# Patient Record
Sex: Female | Born: 1965 | Race: White | Hispanic: No | Marital: Married | Smoking: Never smoker
Health system: Southern US, Community
[De-identification: ages and names within clinical notes are randomized; demographics above are authoritative.]

## PROBLEM LIST (undated history)

## (undated) DIAGNOSIS — Z9889 Other specified postprocedural states: Secondary | ICD-10-CM

## (undated) DIAGNOSIS — R112 Nausea with vomiting, unspecified: Secondary | ICD-10-CM

## (undated) DIAGNOSIS — B999 Unspecified infectious disease: Secondary | ICD-10-CM

---

## 2009-03-30 ENCOUNTER — Encounter: Admission: RE | Admit: 2009-03-30 | Discharge: 2009-03-30 | Payer: Self-pay | Admitting: Specialist

## 2012-09-18 ENCOUNTER — Other Ambulatory Visit: Payer: Self-pay

## 2012-09-18 DIAGNOSIS — Z1231 Encounter for screening mammogram for malignant neoplasm of breast: Secondary | ICD-10-CM

## 2012-10-16 ENCOUNTER — Ambulatory Visit
Admission: RE | Admit: 2012-10-16 | Discharge: 2012-10-16 | Disposition: A | Discharge status: Home / Self Care | Payer: BC Managed Care – PPO | Source: Ambulatory Visit

## 2012-10-16 DIAGNOSIS — Z1231 Encounter for screening mammogram for malignant neoplasm of breast: Secondary | ICD-10-CM

## 2015-11-29 DIAGNOSIS — R109 Unspecified abdominal pain: Secondary | ICD-10-CM | POA: Diagnosis not present

## 2015-11-29 DIAGNOSIS — D649 Anemia, unspecified: Secondary | ICD-10-CM | POA: Diagnosis not present

## 2015-11-30 DIAGNOSIS — K828 Other specified diseases of gallbladder: Secondary | ICD-10-CM | POA: Diagnosis not present

## 2015-11-30 DIAGNOSIS — K760 Fatty (change of) liver, not elsewhere classified: Secondary | ICD-10-CM | POA: Diagnosis not present

## 2015-11-30 DIAGNOSIS — K8020 Calculus of gallbladder without cholecystitis without obstruction: Secondary | ICD-10-CM | POA: Diagnosis not present

## 2015-12-01 ENCOUNTER — Other Ambulatory Visit: Payer: Self-pay | Admitting: Internal Medicine

## 2015-12-01 DIAGNOSIS — R109 Unspecified abdominal pain: Secondary | ICD-10-CM

## 2015-12-07 DIAGNOSIS — K8010 Calculus of gallbladder with chronic cholecystitis without obstruction: Secondary | ICD-10-CM | POA: Diagnosis not present

## 2015-12-11 ENCOUNTER — Encounter (HOSPITAL_COMMUNITY): Payer: Self-pay | Admitting: *Deleted

## 2015-12-12 ENCOUNTER — Ambulatory Visit: Payer: Self-pay | Admitting: Surgery

## 2015-12-12 ENCOUNTER — Encounter (HOSPITAL_COMMUNITY): Payer: Self-pay | Admitting: Anesthesiology

## 2015-12-12 ENCOUNTER — Other Ambulatory Visit: Payer: Self-pay

## 2015-12-12 NOTE — H&P (Signed)
Chief Complaint:  Recurrent right upper quadrant pain  History of Present Illness:  Courtney Freeman is an 50 y.o. female who has had at least 2 bouts of 6 hour right upper quadrant pain with nausea and vomiting.  Ultrasound demonstrated gallstones.  Informed consent was obtained about laparoscopic and open cholecystectomy.    Past Medical History  Diagnosis Date  . PONV (postoperative nausea and vomiting)     after first C Section with receiving codeine  . Infection     pt states had infection in incision after first 3 C Sections; pt states received antibiotic prior to 4th C Section did not have any difficulty     Past Surgical History  Procedure Laterality Date  . Cesarean section      times 4    No current facility-administered medications for this encounter.   Current Outpatient Prescriptions  Medication Sig Dispense Refill  . cetirizine-pseudoephedrine (ZYRTEC-D) 5-120 MG tablet Take 1 tablet by mouth 2 (two) times daily.     Marland Kitchen. ibuprofen (ADVIL) 200 MG tablet Take 200 mg by mouth every 6 (six) hours as needed for moderate pain.     Marland Kitchen. oxyCODONE-acetaminophen (PERCOCET/ROXICET) 5-325 MG tablet Take 1 tablet by mouth every 4 (four) hours as needed for moderate pain.   0   Codeine History reviewed. No pertinent family history. Social History:   reports that she has never smoked. She has never used smokeless tobacco. She reports that she drinks alcohol. She reports that she does not use illicit drugs.   REVIEW OF SYSTEMS : Negative except for see problem list  Physical Exam:   Last menstrual period 11/25/2015. There is no height or weight on file to calculate BMI.  Gen:  WDWN WF NAD  Neurological: Alert and oriented to person, place, and time. Motor and sensory function is grossly intact  Head: Normocephalic and atraumatic.  Eyes: Conjunctivae are normal. Pupils are equal, round, and reactive to light. No scleral icterus.  Neck: Normal range of motion. Neck supple. No tracheal  deviation or thyromegaly present.  Cardiovascular:  SR without murmurs or gallops.  No carotid bruits Breast:  Not examined Respiratory: Effort normal.  No respiratory distress. No chest wall tenderness. Breath sounds normal.  No wheezes, rales or rhonchi.  Abdomen:  Some residual soreness in the right upper quadrant GU:  Not examined Musculoskeletal: Normal range of motion. Extremities are nontender. No cyanosis, edema or clubbing noted Lymphadenopathy: No cervical, preauricular, postauricular or axillary adenopathy is present Skin: Skin is warm and dry. No rash noted. No diaphoresis. No erythema. No pallor. Pscyh: Normal mood and affect. Behavior is normal. Judgment and thought content normal.   LABORATORY RESULTS: No results found for this or any previous visit (from the past 48 hour(s)).   RADIOLOGY RESULTS: No results found.  Problem List: There are no active problems to display for this patient.   Assessment & Plan: Symptomatic gallstones with cholecystitis    Matt B. Daphine DeutscherMartin, MD, [facility]  [facility] [phone] beeper [phone]  12/12/2015 5:51 AM

## 2015-12-12 NOTE — Anesthesia Preprocedure Evaluation (Addendum)
Anesthesia Evaluation  Patient identified by MRN, date of birth, ID band Patient awake    Reviewed: Allergy & Precautions, NPO status , Patient's Chart, lab work & pertinent test results  History of Anesthesia Complications (+) PONV and history of anesthetic complications  Airway Mallampati: II  TM Distance: >3 FB Neck ROM: Full    Dental no notable dental hx.    Pulmonary neg pulmonary ROS,    Pulmonary exam normal breath sounds clear to auscultation       Cardiovascular Exercise Tolerance: Good negative cardio ROS Normal cardiovascular exam Rhythm:Regular Rate:Normal     Neuro/Psych negative neurological ROS  negative psych ROS   GI/Hepatic negative GI ROS, Neg liver ROS,   Endo/Other  negative endocrine ROS  Renal/GU negative Renal ROS  negative genitourinary   Musculoskeletal negative musculoskeletal ROS (+)   Abdominal   Peds negative pediatric ROS (+)  Hematology negative hematology ROS (+)   Anesthesia Other Findings   Reproductive/Obstetrics negative OB ROS                             Anesthesia Physical Anesthesia Plan  ASA: I  Anesthesia Plan: General   Post-op Pain Management:    Induction: Intravenous  Airway Management Planned: Oral ETT  Additional Equipment:   Intra-op Plan:   Post-operative Plan: Extubation in OR  Informed Consent: I have reviewed the patients History and Physical, chart, labs and discussed the procedure including the risks, benefits and alternatives for the proposed anesthesia with the patient or authorized representative who has indicated his/her understanding and acceptance.   Dental advisory given  Plan Discussed with: CRNA  Anesthesia Plan Comments:         Anesthesia Quick Evaluation

## 2015-12-13 ENCOUNTER — Encounter (HOSPITAL_COMMUNITY)
Admission: RE | Disposition: A | Discharge status: Home / Self Care | Payer: Self-pay | Source: Ambulatory Visit | Attending: Surgery

## 2015-12-13 ENCOUNTER — Ambulatory Visit (HOSPITAL_COMMUNITY): Payer: BLUE CROSS/BLUE SHIELD | Admitting: Anesthesiology

## 2015-12-13 ENCOUNTER — Ambulatory Visit (HOSPITAL_COMMUNITY): Payer: BLUE CROSS/BLUE SHIELD

## 2015-12-13 ENCOUNTER — Encounter (HOSPITAL_COMMUNITY): Payer: Self-pay | Admitting: *Deleted

## 2015-12-13 ENCOUNTER — Observation Stay (HOSPITAL_COMMUNITY)
Admission: RE | Admit: 2015-12-13 | Discharge: 2015-12-14 | Disposition: A | Discharge status: Home / Self Care | Payer: BLUE CROSS/BLUE SHIELD | Source: Ambulatory Visit | Attending: Surgery | Admitting: Surgery

## 2015-12-13 DIAGNOSIS — Z419 Encounter for procedure for purposes other than remedying health state, unspecified: Secondary | ICD-10-CM

## 2015-12-13 DIAGNOSIS — K8010 Calculus of gallbladder with chronic cholecystitis without obstruction: Secondary | ICD-10-CM | POA: Diagnosis not present

## 2015-12-13 DIAGNOSIS — K8020 Calculus of gallbladder without cholecystitis without obstruction: Secondary | ICD-10-CM | POA: Diagnosis not present

## 2015-12-13 DIAGNOSIS — Z9049 Acquired absence of other specified parts of digestive tract: Secondary | ICD-10-CM

## 2015-12-13 DIAGNOSIS — Z79899 Other long term (current) drug therapy: Secondary | ICD-10-CM | POA: Diagnosis not present

## 2015-12-13 DIAGNOSIS — K439 Ventral hernia without obstruction or gangrene: Secondary | ICD-10-CM | POA: Diagnosis not present

## 2015-12-13 DIAGNOSIS — K811 Chronic cholecystitis: Secondary | ICD-10-CM | POA: Diagnosis present

## 2015-12-13 HISTORY — DX: Unspecified infectious disease: B99.9

## 2015-12-13 HISTORY — DX: Other specified postprocedural states: Z98.890

## 2015-12-13 HISTORY — DX: Nausea with vomiting, unspecified: R11.2

## 2015-12-13 HISTORY — PX: CHOLECYSTECTOMY: SHX55

## 2015-12-13 LAB — CBC
HCT: 34.2 % — ABNORMAL LOW (ref 36.0–46.0)
HCT: 33.3 % — ABNORMAL LOW (ref 36.0–46.0)
HEMOGLOBIN: 11 g/dL — AB (ref 12.0–15.0)
Hemoglobin: 10.6 g/dL — ABNORMAL LOW (ref 12.0–15.0)
MCH: 26.1 pg (ref 26.0–34.0)
MCH: 26.6 pg (ref 26.0–34.0)
MCHC: 32.2 g/dL (ref 30.0–36.0)
MCHC: 31.8 g/dL (ref 30.0–36.0)
MCV: 81 fL (ref 78.0–100.0)
MCV: 83.5 fL (ref 78.0–100.0)
PLATELETS: 301 10*3/uL (ref 150–400)
PLATELETS: 285 10*3/uL (ref 150–400)
RBC: 4.22 MIL/uL (ref 3.87–5.11)
RBC: 3.99 MIL/uL (ref 3.87–5.11)
RDW: 14.8 % (ref 11.5–15.5)
RDW: 14.9 % (ref 11.5–15.5)
WBC: 6.1 10*3/uL (ref 4.0–10.5)
WBC: 8.9 10*3/uL (ref 4.0–10.5)

## 2015-12-13 LAB — CREATININE, SERUM
Creatinine, Ser: 0.7 mg/dL (ref 0.44–1.00)
GFR calc Af Amer: >60 mL/min (ref >60)
GFR calc non Af Amer: >60 mL/min (ref >60)

## 2015-12-13 LAB — HCG, SERUM, QUALITATIVE: PREG SERUM: NEGATIVE

## 2015-12-13 SURGERY — LAPAROSCOPIC CHOLECYSTECTOMY WITH INTRAOPERATIVE CHOLANGIOGRAM
Anesthesia: General | Site: Abdomen

## 2015-12-13 MED ORDER — ACETAMINOPHEN 10 MG/ML IV SOLN
INTRAVENOUS | Status: AC
  Filled 2015-12-13: qty 100

## 2015-12-13 MED ORDER — LIDOCAINE HCL (CARDIAC) 20 MG/ML IV SOLN
INTRAVENOUS | Status: AC
  Filled 2015-12-13: qty 5

## 2015-12-13 MED ORDER — KCL IN DEXTROSE-NACL 20-5-0.45 MEQ/L-%-% IV SOLN
INTRAVENOUS | Status: DC
  Administered 2015-12-13 (×2): via INTRAVENOUS
  Filled 2015-12-13 (×2): qty 1000

## 2015-12-13 MED ORDER — MIDAZOLAM HCL 5 MG/5ML IJ SOLN
INTRAMUSCULAR | Status: DC | PRN
  Administered 2015-12-13: 2 mg via INTRAVENOUS

## 2015-12-13 MED ORDER — SUGAMMADEX SODIUM 200 MG/2ML IV SOLN
INTRAVENOUS | Status: DC | PRN
  Administered 2015-12-13: 175 mg via INTRAVENOUS

## 2015-12-13 MED ORDER — LIDOCAINE HCL (CARDIAC) 20 MG/ML IV SOLN
INTRAVENOUS | Status: DC | PRN
  Administered 2015-12-13: 50 mg via INTRAVENOUS

## 2015-12-13 MED ORDER — IOPAMIDOL (ISOVUE-300) INJECTION 61%
INTRAVENOUS | Status: AC
Start: 2015-12-13 — End: 2015-12-13
  Filled 2015-12-13: qty 50

## 2015-12-13 MED ORDER — CHLORHEXIDINE GLUCONATE CLOTH 2 % EX PADS: 6.0000 | MEDICATED_PAD | Freq: Once | CUTANEOUS | Status: DC

## 2015-12-13 MED ORDER — 0.9 % SODIUM CHLORIDE (POUR BTL) OPTIME
TOPICAL | Status: DC | PRN
  Administered 2015-12-13: 1000 mL

## 2015-12-13 MED ORDER — PROMETHAZINE HCL 25 MG/ML IJ SOLN: 6.2500 mg | INTRAMUSCULAR | Status: DC | PRN

## 2015-12-13 MED ORDER — CEFAZOLIN SODIUM-DEXTROSE 2-4 GM/100ML-% IV SOLN
INTRAVENOUS | Status: AC
  Filled 2015-12-13: qty 100

## 2015-12-13 MED ORDER — OXYCODONE-ACETAMINOPHEN 5-325 MG PO TABS
1.0000 | ORAL_TABLET | ORAL | Status: DC | PRN
  Administered 2015-12-13 (×2): 2 via ORAL
  Filled 2015-12-13 (×2): qty 2

## 2015-12-13 MED ORDER — FENTANYL CITRATE (PF) 250 MCG/5ML IJ SOLN
INTRAMUSCULAR | Status: AC
  Filled 2015-12-13: qty 5

## 2015-12-13 MED ORDER — ROCURONIUM BROMIDE 100 MG/10ML IV SOLN
INTRAVENOUS | Status: DC | PRN
  Administered 2015-12-13: 40 mg via INTRAVENOUS

## 2015-12-13 MED ORDER — LACTATED RINGERS IV SOLN
INTRAVENOUS | Status: DC
  Administered 2015-12-13 (×2): via INTRAVENOUS

## 2015-12-13 MED ORDER — HEPARIN SODIUM (PORCINE) 5000 UNIT/ML IJ SOLN
5000.0000 [IU] | Freq: Three times a day (TID) | INTRAMUSCULAR | Status: DC
  Administered 2015-12-13 – 2015-12-14 (×2): 5000 [IU] via SUBCUTANEOUS
  Filled 2015-12-13 (×3): qty 1

## 2015-12-13 MED ORDER — PROMETHAZINE HCL 25 MG/ML IJ SOLN
12.5000 mg | Freq: Four times a day (QID) | INTRAMUSCULAR | Status: DC | PRN
  Administered 2015-12-13: 12.5 mg via INTRAVENOUS
  Filled 2015-12-13 (×2): qty 1

## 2015-12-13 MED ORDER — ONDANSETRON HCL 4 MG/2ML IJ SOLN
4.0000 mg | Freq: Four times a day (QID) | INTRAMUSCULAR | Status: DC | PRN
  Administered 2015-12-13: 4 mg via INTRAVENOUS
  Filled 2015-12-13: qty 2

## 2015-12-13 MED ORDER — HYDROMORPHONE HCL 1 MG/ML IJ SOLN: 0.5000 mg | INTRAMUSCULAR | Status: DC | PRN

## 2015-12-13 MED ORDER — PROPOFOL 10 MG/ML IV BOLUS
INTRAVENOUS | Status: DC | PRN
  Administered 2015-12-13: 180 mg via INTRAVENOUS

## 2015-12-13 MED ORDER — BUPIVACAINE LIPOSOME 1.3 % IJ SUSP
20.0000 mL | Freq: Once | INTRAMUSCULAR | Status: DC
  Filled 2015-12-13: qty 20

## 2015-12-13 MED ORDER — ACETAMINOPHEN 10 MG/ML IV SOLN
INTRAVENOUS | Status: DC | PRN
  Administered 2015-12-13: 1000 mg via INTRAVENOUS

## 2015-12-13 MED ORDER — ONDANSETRON 4 MG PO TBDP: 4.0000 mg | ORAL_TABLET | Freq: Four times a day (QID) | ORAL | Status: DC | PRN

## 2015-12-13 MED ORDER — SUGAMMADEX SODIUM 200 MG/2ML IV SOLN
INTRAVENOUS | Status: AC
  Filled 2015-12-13: qty 2

## 2015-12-13 MED ORDER — ONDANSETRON HCL 4 MG/2ML IJ SOLN
INTRAMUSCULAR | Status: AC
  Filled 2015-12-13: qty 2

## 2015-12-13 MED ORDER — BUPIVACAINE-EPINEPHRINE 0.25% -1:200000 IJ SOLN
INTRAMUSCULAR | Status: AC
  Filled 2015-12-13: qty 1

## 2015-12-13 MED ORDER — FENTANYL CITRATE (PF) 100 MCG/2ML IJ SOLN
INTRAMUSCULAR | Status: DC | PRN
  Administered 2015-12-13: 50 ug via INTRAVENOUS
  Administered 2015-12-13: 100 ug via INTRAVENOUS
  Administered 2015-12-13 (×2): 50 ug via INTRAVENOUS

## 2015-12-13 MED ORDER — CEFAZOLIN SODIUM-DEXTROSE 2-4 GM/100ML-% IV SOLN
2.0000 g | INTRAVENOUS | Status: AC
  Administered 2015-12-13: 2 g via INTRAVENOUS

## 2015-12-13 MED ORDER — DEXAMETHASONE SODIUM PHOSPHATE 10 MG/ML IJ SOLN
INTRAMUSCULAR | Status: AC
  Filled 2015-12-13: qty 1

## 2015-12-13 MED ORDER — BUPIVACAINE-EPINEPHRINE (PF) 0.25% -1:200000 IJ SOLN
INTRAMUSCULAR | Status: DC | PRN
  Administered 2015-12-13: 7 mL

## 2015-12-13 MED ORDER — HYDROMORPHONE HCL 1 MG/ML IJ SOLN: 0.2500 mg | INTRAMUSCULAR | Status: DC | PRN

## 2015-12-13 MED ORDER — DEXAMETHASONE SODIUM PHOSPHATE 10 MG/ML IJ SOLN
INTRAMUSCULAR | Status: DC | PRN
  Administered 2015-12-13: 10 mg via INTRAVENOUS

## 2015-12-13 MED ORDER — ONDANSETRON HCL 4 MG/2ML IJ SOLN
INTRAMUSCULAR | Status: DC | PRN
  Administered 2015-12-13: 4 mg via INTRAVENOUS

## 2015-12-13 MED ORDER — LACTATED RINGERS IR SOLN
Status: DC | PRN
  Administered 2015-12-13: 1000 mL

## 2015-12-13 MED ORDER — IOPAMIDOL (ISOVUE-300) INJECTION 61%
INTRAVENOUS | Status: DC | PRN
  Administered 2015-12-13: 2 mL

## 2015-12-13 MED ORDER — CEFAZOLIN SODIUM-DEXTROSE 2-4 GM/100ML-% IV SOLN
2.0000 g | Freq: Three times a day (TID) | INTRAVENOUS | Status: AC
  Administered 2015-12-13: 2 g via INTRAVENOUS
  Filled 2015-12-13: qty 100

## 2015-12-13 MED ORDER — OXYCODONE-ACETAMINOPHEN 5-325 MG PO TABS
1.0000 | ORAL_TABLET | ORAL | Status: DC | PRN
Start: 2015-12-13 — End: 2015-12-13

## 2015-12-13 MED ORDER — BUPIVACAINE LIPOSOME 1.3 % IJ SUSP
INTRAMUSCULAR | Status: DC | PRN
  Administered 2015-12-13: 20 mL

## 2015-12-13 MED ORDER — ROCURONIUM BROMIDE 100 MG/10ML IV SOLN
INTRAVENOUS | Status: AC
  Filled 2015-12-13: qty 1

## 2015-12-13 MED ORDER — HEPARIN SODIUM (PORCINE) 5000 UNIT/ML IJ SOLN
5000.0000 [IU] | Freq: Once | INTRAMUSCULAR | Status: AC
  Administered 2015-12-13: 5000 [IU] via SUBCUTANEOUS
  Filled 2015-12-13: qty 1

## 2015-12-13 MED ORDER — MIDAZOLAM HCL 2 MG/2ML IJ SOLN
INTRAMUSCULAR | Status: AC
  Filled 2015-12-13: qty 2

## 2015-12-13 MED ORDER — PROPOFOL 10 MG/ML IV BOLUS
INTRAVENOUS | Status: AC
  Filled 2015-12-13: qty 20

## 2015-12-13 SURGICAL SUPPLY — 37 items
APPLIER CLIP ROT 10 11.4 M/L (STAPLE) ×2
BENZOIN TINCTURE PRP APPL 2/3 (GAUZE/BANDAGES/DRESSINGS) IMPLANT
CABLE HIGH FREQUENCY MONO STRZ (ELECTRODE) IMPLANT
CATH REDDICK CHOLANGI 4FR 50CM (CATHETERS) ×2 IMPLANT
CLIP APPLIE ROT 10 11.4 M/L (STAPLE) ×1 IMPLANT
COVER MAYO STAND STRL (DRAPES) ×2 IMPLANT
COVER SURGICAL LIGHT HANDLE (MISCELLANEOUS) ×2 IMPLANT
DECANTER SPIKE VIAL GLASS SM (MISCELLANEOUS) ×2 IMPLANT
DRAPE C-ARM 42X120 X-RAY (DRAPES) ×2 IMPLANT
DRAPE LAPAROSCOPIC ABDOMINAL (DRAPES) ×2 IMPLANT
ELECT PENCIL ROCKER SW 15FT (MISCELLANEOUS) ×2 IMPLANT
ELECT REM PT RETURN 9FT ADLT (ELECTROSURGICAL) ×2
ELECTRODE REM PT RTRN 9FT ADLT (ELECTROSURGICAL) ×1 IMPLANT
GLOVE BIOGEL M 8.0 STRL (GLOVE) ×2 IMPLANT
GOWN STRL REUS W/TWL XL LVL3 (GOWN DISPOSABLE) ×8 IMPLANT
HEMOSTAT SURGICEL 4X8 (HEMOSTASIS) IMPLANT
IV CATH 14GX2 1/4 (CATHETERS) ×2 IMPLANT
KIT BASIN OR (CUSTOM PROCEDURE TRAY) ×2 IMPLANT
L-HOOK LAP DISP 36CM (ELECTROSURGICAL)
LHOOK LAP DISP 36CM (ELECTROSURGICAL) IMPLANT
LIQUID BAND (GAUZE/BANDAGES/DRESSINGS) ×2 IMPLANT
POUCH RETRIEVAL ECOSAC 10 (ENDOMECHANICALS) IMPLANT
POUCH RETRIEVAL ECOSAC 10MM (ENDOMECHANICALS)
SCISSORS LAP 5X45 EPIX DISP (ENDOMECHANICALS) ×2 IMPLANT
SCRUB TECHNI CARE 4 OZ NO DYE (MISCELLANEOUS) ×2 IMPLANT
SET IRRIG TUBING LAPAROSCOPIC (IRRIGATION / IRRIGATOR) ×2 IMPLANT
SLEEVE XCEL OPT CAN 5 100 (ENDOMECHANICALS) ×4 IMPLANT
STRIP CLOSURE SKIN 1/2X4 (GAUZE/BANDAGES/DRESSINGS) IMPLANT
SUT VIC AB 4-0 SH 18 (SUTURE) ×2 IMPLANT
SYR 20CC LL (SYRINGE) ×2 IMPLANT
TOWEL OR 17X26 10 PK STRL BLUE (TOWEL DISPOSABLE) ×2 IMPLANT
TRAY LAPAROSCOPIC (CUSTOM PROCEDURE TRAY) ×2 IMPLANT
TROCAR BLADELESS OPT 5 100 (ENDOMECHANICALS) ×2 IMPLANT
TROCAR HASSON 5MM (TROCAR) ×2 IMPLANT
TROCAR XCEL BLUNT TIP 100MML (ENDOMECHANICALS) IMPLANT
TROCAR XCEL NON-BLD 11X100MML (ENDOMECHANICALS) ×4 IMPLANT
TUBING INSUF HEATED (TUBING) ×2 IMPLANT

## 2015-12-13 NOTE — Op Note (Signed)
Salli Realliza A Karl   12/13/2015  3:14 PM  Procedure: Laparoscopic Cholecystectomy with intraoperative cholangiogram  Surgeon: Susy FrizzleMatt B. Daphine DeutscherMartin, MD, FACS Asst:  none  Anes:  General  Drains:  None  Findings: Chronic cholecystitis with normal IOC;  Small ventral hernia is 3 fb below the umbilicus  Description of Procedure: The patient was taken to OR 1 and given general anesthesia.  The patient was prepped with Technicare and draped sterilely. A time out was performed.  Access to the abdomen was achieved with a 5 mm Hasson through the umbilicus.  Port placement included three 5 mm trocars and an 11 mm in the upper midline.    The gallbladder was visualized and the fundus was grasped and the gallbladder was elevated. Traction on the infundibulum allowed for successful demonstration of the critical view. Inflammatory changes were chronic with a thickened, white gallbladder.  The cystic duct was identified and clipped up on the gallbladder and an incision was made in the cystic duct and the Reddick catheter was inserted after milking the cystic duct of any debris. A dynamic cholangiogram was performed which demonstrated good intrahepatic filling and free flow into the duodenum.    The cystic duct was then triple clipped and divided, the cystic artery was double clipped and divided and then the gallbladder was removed from the gallbladder bed. Removal of the gallbladder from the gallbladder bed was performed with the hook electrocautery without entering it or spilling.  The gallbladder was then placed in a bag and brought out through one of the trocar sites. The gallbladder bed was inspected and no bleeding or bile leaks were seen.   Laparoscopic visualization was used when closing fascial defects for trocar sites.   Incisions were injected with Exparel and closed with 4-0 Vicryl and Liquiban on the skin.  Sponge and needle count were correct.    The patient was taken to the recovery room in satisfactory  condition.

## 2015-12-13 NOTE — Discharge Instructions (Signed)
Laparoscopic Cholecystectomy Laparoscopic cholecystectomy is surgery to remove the gallbladder. The gallbladder is located in the upper right part of the abdomen, behind the liver. It is a storage sac for bile, which is produced in the liver. Bile aids in the digestion and absorption of fats. Cholecystectomy is often done for inflammation of the gallbladder (cholecystitis). This condition is usually caused by a buildup of gallstones (cholelithiasis) in the gallbladder. Gallstones can block the flow of bile, and that can result in inflammation and pain. In severe cases, emergency surgery may be required. If emergency surgery is not required, you will have time to prepare for the procedure. Laparoscopic surgery is an alternative to open surgery. Laparoscopic surgery has a shorter recovery time. Your common bile duct may also need to be examined during the procedure. If stones are found in the common bile duct, they may be removed. LET YOUR HEALTH CARE PROVIDER KNOW ABOUT:  Any allergies you have.  All medicines you are taking, including vitamins, herbs, eye drops, creams, and over-the-counter medicines.  Previous problems you or members of your family have had with the use of anesthetics.  Any blood disorders you have.  Previous surgeries you have had.  Any medical conditions you have. RISKS AND COMPLICATIONS Generally, this is a safe procedure. However, problems may occur, including:  Infection.  Bleeding.  Allergic reactions to medicines.  Damage to other structures or organs.  A stone remaining in the common bile duct.  A bile leak from the cyst duct that is clipped when your gallbladder is removed.  The need to convert to open surgery, which requires a larger incision in the abdomen. This may be necessary if your surgeon thinks that it is not safe to continue with a laparoscopic procedure. BEFORE THE PROCEDURE  Ask your health care provider about:  Changing or stopping your  regular medicines. This is especially important if you are taking diabetes medicines or blood thinners.  Taking medicines such as aspirin and ibuprofen. These medicines can thin your blood. Do not take these medicines before your procedure if your health care provider instructs you not to.  Follow instructions from your health care provider about eating or drinking restrictions.  Let your health care provider know if you develop a cold or an infection before surgery.  Plan to have someone take you home after the procedure.  Ask your health care provider how your surgical site will be marked or identified.  You may be given antibiotic medicine to help prevent infection. PROCEDURE  To reduce your risk of infection:  Your health care team will wash or sanitize their hands.  Your skin will be washed with soap.  An IV tube may be inserted into one of your veins.  You will be given a medicine to make you fall asleep (general anesthetic).  A breathing tube will be placed in your mouth.  The surgeon will make several small cuts (incisions) in your abdomen.  A thin, lighted tube (laparoscope) that has a tiny camera on the end will be inserted through one of the small incisions. The camera on the laparoscope will send a picture to a TV screen (monitor) in the operating room. This will give the surgeon a good view inside your abdomen.  A gas will be pumped into your abdomen. This will expand your abdomen to give the surgeon more room to perform the surgery.  Other tools that are needed for the procedure will be inserted through the other incisions. The gallbladder will   be removed through one of the incisions.  After your gallbladder has been removed, the incisions will be closed with stitches (sutures), staples, or skin glue.  Your incisions may be covered with a bandage (dressing). The procedure may vary among health care providers and hospitals. AFTER THE PROCEDURE  Your blood  pressure, heart rate, breathing rate, and blood oxygen level will be monitored often until the medicines you were given have worn off.  You will be given medicines as needed to control your pain.   This information is not intended to replace advice given to you by your health care provider. Make sure you discuss any questions you have with your health care provider.   Document Released: 06/03/2005 Document Revised: 02/22/2015 Document Reviewed: 01/13/2013 Elsevier Interactive Patient Education 2016 [employer]  

## 2015-12-13 NOTE — Interval H&P Note (Signed)
History and Physical Interval Note:  12/13/2015 1:00 PM  Courtney Freeman  has presented today for surgery, with the diagnosis of chronic cholelithiasis  The various methods of treatment have been discussed with the patient and family. After consideration of risks, benefits and other options for treatment, the patient has consented to  Procedure(s): LAPAROSCOPIC CHOLECYSTECTOMY WITH INTRAOPERATIVE CHOLANGIOGRAM (N/A) as a surgical intervention .  The patient's history has been reviewed, patient examined, no change in status, stable for surgery.  I have reviewed the patient's chart and labs.  Questions were answered to the patient's satisfaction.     Aveen Stansel B

## 2015-12-13 NOTE — Anesthesia Procedure Notes (Signed)
Procedure Name: Intubation Date/Time: 12/13/2015 1:50 PM Performed by: Enriqueta ShutterWILLIFORD, Verneal Wiers D Pre-anesthesia Checklist: Patient identified, Emergency Drugs available, Suction available and Patient being monitored Patient Re-evaluated:Patient Re-evaluated prior to inductionOxygen Delivery Method: Circle system utilized Preoxygenation: Pre-oxygenation with 100% oxygen Intubation Type: IV induction Ventilation: Mask ventilation without difficulty Laryngoscope Size: Mac and 3 Grade View: Grade I Tube type: Oral Tube size: 7.0 mm Number of attempts: 1 Airway Equipment and Method: Stylet Placement Confirmation: ETT inserted through vocal cords under direct vision,  positive ETCO2 and breath sounds checked- equal and bilateral Secured at: 20 cm Tube secured with: Tape Dental Injury: Teeth and Oropharynx as per pre-operative assessment

## 2015-12-13 NOTE — Anesthesia Postprocedure Evaluation (Signed)
Anesthesia Post Note  Patient: Courtney Freeman  Procedure(s) Performed: Procedure(s) (LRB): LAPAROSCOPIC CHOLECYSTECTOMY WITH INTRAOPERATIVE CHOLANGIOGRAM (N/A)  Patient location during evaluation: PACU Anesthesia Type: General Level of consciousness: awake and alert Pain management: pain level controlled Vital Signs Assessment: post-procedure vital signs reviewed and stable Respiratory status: spontaneous breathing, nonlabored ventilation, respiratory function stable and patient connected to nasal cannula oxygen Cardiovascular status: blood pressure returned to baseline and stable Postop Assessment: no signs of nausea or vomiting Anesthetic complications: no    Last Vitals:  Filed Vitals:   12/13/15 1600 12/13/15 1620  BP: 138/80 144/85  Pulse: 88 83  Temp:  36.4 C  Resp: 16 16    Last Pain: There were no vitals filed for this visit.               Sharonne Ricketts J

## 2015-12-13 NOTE — Transfer of Care (Signed)
Immediate Anesthesia Transfer of Care Note  Patient: Courtney RealEliza A Freeman  Procedure(s) Performed: Procedure(s): LAPAROSCOPIC CHOLECYSTECTOMY WITH INTRAOPERATIVE CHOLANGIOGRAM (N/A)  Patient Location: PACU  Anesthesia Type:General  Level of Consciousness: awake, alert  and oriented  Airway & Oxygen Therapy: Patient Spontanous Breathing and Patient connected to face mask oxygen  Post-op Assessment: Report given to RN and Post -op Vital signs reviewed and stable  Post vital signs: Reviewed and stable  Last Vitals:  Filed Vitals:   12/13/15 1122  BP: 139/87  Pulse: 86  Temp: 36.5 C  Resp: 18    Last Pain: There were no vitals filed for this visit.    Patients Stated Pain Goal: 3 (12/13/15 1203)  Complications: No apparent anesthesia complications

## 2015-12-14 DIAGNOSIS — Z79899 Other long term (current) drug therapy: Secondary | ICD-10-CM | POA: Diagnosis not present

## 2015-12-14 DIAGNOSIS — K439 Ventral hernia without obstruction or gangrene: Secondary | ICD-10-CM | POA: Diagnosis not present

## 2015-12-14 DIAGNOSIS — K8010 Calculus of gallbladder with chronic cholecystitis without obstruction: Secondary | ICD-10-CM | POA: Diagnosis not present

## 2015-12-14 MED ORDER — TRAMADOL HCL 50 MG PO TABS: 50.0000 mg | ORAL_TABLET | Freq: Four times a day (QID) | ORAL | Status: AC | PRN

## 2015-12-14 NOTE — Discharge Summary (Signed)
Physician Discharge Summary  Patient ID: Courtney Freeman MRN: [id number] DOB/AGE: 51/01/1966 51 y.o.  Admit date: 12/13/2015 Discharge date: 12/14/2015  Admission Diagnoses:  Chronic cholecystitis  Discharge Diagnoses:  same  Principal Problem:   S/P laparoscopic cholecystectomy June 2017 Active Problems:   Chronic cholecystitis   Surgery:  Lap chole with IOC  Discharged Condition: improved  Hospital Course:   Had surgery.  Kept overnight and did well. Ready for discharge  Consults: none  Significant Diagnostic Studies: none    Discharge Exam: Blood pressure 96/53, pulse 72, temperature 97.8 F (36.6 C), temperature source Oral, resp. rate 15, height 5\' 3"  (1.6 m), weight 86.365 kg (190 lb 6.4 oz), last menstrual period 11/25/2015, SpO2 99 %. Incisions OK  Disposition: Final discharge disposition not confirmed  Discharge Instructions    Call MD for:  redness, tenderness, or signs of infection (pain, swelling, redness, odor or green/yellow discharge around incision site)    Complete by:  As directed      Call MD for:  severe uncontrolled pain    Complete by:  As directed      Diet - low sodium heart healthy    Complete by:  As directed      Discharge instructions    Complete by:  As directed   May resume regular diet May take Tylenol or Ibuprofen for pain.  Will give script for Tramadol to see if that is better tolerated for pain.  You can have it to use if you need it.     Discharge wound care:    Complete by:  As directed   May shower.  The adhesive dressing will peel off.     Increase activity slowly    Complete by:  As directed             Medication List    TAKE these medications        ADVIL 200 MG tablet  Generic drug:  ibuprofen  Take 200 mg by mouth every 6 (six) hours as needed for moderate pain.     cetirizine-pseudoephedrine 5-120 MG tablet  Commonly known as:  ZYRTEC-D  Take 1 tablet by mouth 2 (two) times daily.     oxyCODONE-acetaminophen  5-325 MG tablet  Commonly known as:  PERCOCET/ROXICET  Take 1 tablet by mouth every 4 (four) hours as needed for moderate pain.     traMADol 50 MG tablet  Commonly known as:  ULTRAM  Take 1 tablet (50 mg total) by mouth every 6 (six) hours as needed.           Follow-up Information    Follow up with Valarie MerinoMARTIN,Berry Godsey B, MD.   Specialty:  General Surgery   Contact information:   [address] [place] [address] [phone]       Signed: Valarie MerinoMARTIN,Gwenyth Dingee B 12/14/2015, 7:30 AM

## 2015-12-14 NOTE — Progress Notes (Signed)
Went over d/c instructions with patient and her husband.  Both verbalized understanding.  Patient left hospital via w/c with all belongings and hard script. Levora AngelHannah V Adriana Lina, RN

## 2016-01-03 DIAGNOSIS — D649 Anemia, unspecified: Secondary | ICD-10-CM | POA: Diagnosis not present

## 2016-01-03 DIAGNOSIS — K439 Ventral hernia without obstruction or gangrene: Secondary | ICD-10-CM | POA: Diagnosis not present

## 2016-04-22 DIAGNOSIS — Z23 Encounter for immunization: Secondary | ICD-10-CM | POA: Diagnosis not present

## 2016-04-29 DIAGNOSIS — M2042 Other hammer toe(s) (acquired), left foot: Secondary | ICD-10-CM | POA: Diagnosis not present

## 2016-04-29 DIAGNOSIS — M205X2 Other deformities of toe(s) (acquired), left foot: Secondary | ICD-10-CM | POA: Diagnosis not present

## 2016-05-03 DIAGNOSIS — Q6689 Other  specified congenital deformities of feet: Secondary | ICD-10-CM | POA: Diagnosis not present

## 2016-05-03 DIAGNOSIS — M205X2 Other deformities of toe(s) (acquired), left foot: Secondary | ICD-10-CM | POA: Diagnosis not present

## 2016-05-03 DIAGNOSIS — M2042 Other hammer toe(s) (acquired), left foot: Secondary | ICD-10-CM | POA: Diagnosis not present

## 2016-05-17 ENCOUNTER — Other Ambulatory Visit: Payer: Self-pay | Admitting: Internal Medicine

## 2016-05-17 DIAGNOSIS — H40013 Open angle with borderline findings, low risk, bilateral: Secondary | ICD-10-CM | POA: Diagnosis not present

## 2016-05-17 DIAGNOSIS — Z1231 Encounter for screening mammogram for malignant neoplasm of breast: Secondary | ICD-10-CM

## 2016-05-20 ENCOUNTER — Ambulatory Visit
Admission: RE | Admit: 2016-05-20 | Discharge: 2016-05-20 | Disposition: A | Discharge status: Home / Self Care | Payer: BLUE CROSS/BLUE SHIELD | Source: Ambulatory Visit | Attending: Internal Medicine | Admitting: Internal Medicine

## 2016-05-20 DIAGNOSIS — Z1231 Encounter for screening mammogram for malignant neoplasm of breast: Secondary | ICD-10-CM

## 2016-06-05 DIAGNOSIS — J301 Allergic rhinitis due to pollen: Secondary | ICD-10-CM | POA: Diagnosis not present

## 2016-06-05 DIAGNOSIS — J3081 Allergic rhinitis due to animal (cat) (dog) hair and dander: Secondary | ICD-10-CM | POA: Diagnosis not present

## 2016-06-05 DIAGNOSIS — R062 Wheezing: Secondary | ICD-10-CM | POA: Diagnosis not present

## 2016-06-05 DIAGNOSIS — J3089 Other allergic rhinitis: Secondary | ICD-10-CM | POA: Diagnosis not present

## 2016-06-11 DIAGNOSIS — Z4789 Encounter for other orthopedic aftercare: Secondary | ICD-10-CM | POA: Diagnosis not present

## 2017-01-07 DIAGNOSIS — R05 Cough: Secondary | ICD-10-CM | POA: Diagnosis not present

## 2017-01-07 DIAGNOSIS — J3081 Allergic rhinitis due to animal (cat) (dog) hair and dander: Secondary | ICD-10-CM | POA: Diagnosis not present

## 2017-01-07 DIAGNOSIS — J3089 Other allergic rhinitis: Secondary | ICD-10-CM | POA: Diagnosis not present

## 2017-01-07 DIAGNOSIS — H1045 Other chronic allergic conjunctivitis: Secondary | ICD-10-CM | POA: Diagnosis not present

## 2017-01-07 DIAGNOSIS — J301 Allergic rhinitis due to pollen: Secondary | ICD-10-CM | POA: Diagnosis not present

## 2017-01-07 DIAGNOSIS — Z0000 Encounter for general adult medical examination without abnormal findings: Secondary | ICD-10-CM | POA: Diagnosis not present

## 2017-01-10 DIAGNOSIS — Z23 Encounter for immunization: Secondary | ICD-10-CM | POA: Diagnosis not present

## 2017-01-10 DIAGNOSIS — N959 Unspecified menopausal and perimenopausal disorder: Secondary | ICD-10-CM | POA: Diagnosis not present

## 2017-01-10 DIAGNOSIS — Z780 Asymptomatic menopausal state: Secondary | ICD-10-CM | POA: Diagnosis not present

## 2017-01-10 DIAGNOSIS — Z0000 Encounter for general adult medical examination without abnormal findings: Secondary | ICD-10-CM | POA: Diagnosis not present

## 2017-04-14 DIAGNOSIS — Z1151 Encounter for screening for human papillomavirus (HPV): Secondary | ICD-10-CM | POA: Diagnosis not present

## 2017-04-14 DIAGNOSIS — Z01419 Encounter for gynecological examination (general) (routine) without abnormal findings: Secondary | ICD-10-CM | POA: Diagnosis not present

## 2017-07-24 ENCOUNTER — Other Ambulatory Visit: Payer: Self-pay | Admitting: Plastic Surgery

## 2017-07-24 DIAGNOSIS — L720 Epidermal cyst: Secondary | ICD-10-CM | POA: Diagnosis not present

## 2017-12-22 DIAGNOSIS — S40861A Insect bite (nonvenomous) of right upper arm, initial encounter: Secondary | ICD-10-CM | POA: Diagnosis not present

## 2018-01-06 DIAGNOSIS — E559 Vitamin D deficiency, unspecified: Secondary | ICD-10-CM | POA: Diagnosis not present

## 2018-01-06 DIAGNOSIS — Z0000 Encounter for general adult medical examination without abnormal findings: Secondary | ICD-10-CM | POA: Diagnosis not present

## 2018-01-12 DIAGNOSIS — L918 Other hypertrophic disorders of the skin: Secondary | ICD-10-CM | POA: Diagnosis not present

## 2018-01-12 DIAGNOSIS — Z0000 Encounter for general adult medical examination without abnormal findings: Secondary | ICD-10-CM | POA: Diagnosis not present

## 2018-01-20 DIAGNOSIS — H5203 Hypermetropia, bilateral: Secondary | ICD-10-CM | POA: Diagnosis not present

## 2018-04-17 DIAGNOSIS — Z01419 Encounter for gynecological examination (general) (routine) without abnormal findings: Secondary | ICD-10-CM | POA: Diagnosis not present

## 2019-01-13 DIAGNOSIS — D649 Anemia, unspecified: Secondary | ICD-10-CM | POA: Diagnosis not present

## 2019-01-13 DIAGNOSIS — Z0000 Encounter for general adult medical examination without abnormal findings: Secondary | ICD-10-CM | POA: Diagnosis not present

## 2019-01-28 DIAGNOSIS — B029 Zoster without complications: Secondary | ICD-10-CM | POA: Diagnosis not present

## 2019-01-28 DIAGNOSIS — Z0000 Encounter for general adult medical examination without abnormal findings: Secondary | ICD-10-CM | POA: Diagnosis not present

## 2019-01-28 DIAGNOSIS — Z01419 Encounter for gynecological examination (general) (routine) without abnormal findings: Secondary | ICD-10-CM | POA: Diagnosis not present

## 2019-01-28 DIAGNOSIS — Z1151 Encounter for screening for human papillomavirus (HPV): Secondary | ICD-10-CM | POA: Diagnosis not present

## 2019-04-05 DIAGNOSIS — H524 Presbyopia: Secondary | ICD-10-CM | POA: Diagnosis not present

## 2019-04-05 DIAGNOSIS — Z1159 Encounter for screening for other viral diseases: Secondary | ICD-10-CM | POA: Diagnosis not present

## 2019-04-05 DIAGNOSIS — H5203 Hypermetropia, bilateral: Secondary | ICD-10-CM | POA: Diagnosis not present

## 2019-04-09 DIAGNOSIS — Z1211 Encounter for screening for malignant neoplasm of colon: Secondary | ICD-10-CM | POA: Diagnosis not present

## 2019-04-09 DIAGNOSIS — Q438 Other specified congenital malformations of intestine: Secondary | ICD-10-CM | POA: Diagnosis not present

## 2019-04-09 DIAGNOSIS — K5730 Diverticulosis of large intestine without perforation or abscess without bleeding: Secondary | ICD-10-CM | POA: Diagnosis not present

## 2019-05-28 ENCOUNTER — Other Ambulatory Visit: Payer: Self-pay | Admitting: Internal Medicine

## 2019-05-28 DIAGNOSIS — Z1231 Encounter for screening mammogram for malignant neoplasm of breast: Secondary | ICD-10-CM

## 2019-06-01 ENCOUNTER — Other Ambulatory Visit: Payer: Self-pay

## 2019-06-01 ENCOUNTER — Ambulatory Visit
Admission: RE | Admit: 2019-06-01 | Discharge: 2019-06-01 | Disposition: A | Discharge status: Home / Self Care | Payer: BLUE CROSS/BLUE SHIELD | Source: Ambulatory Visit | Attending: Internal Medicine | Admitting: Internal Medicine

## 2019-06-01 DIAGNOSIS — Z1231 Encounter for screening mammogram for malignant neoplasm of breast: Secondary | ICD-10-CM | POA: Diagnosis not present

## 2019-07-19 DIAGNOSIS — Z03818 Encounter for observation for suspected exposure to other biological agents ruled out: Secondary | ICD-10-CM | POA: Diagnosis not present

## 2020-01-27 DIAGNOSIS — Z0000 Encounter for general adult medical examination without abnormal findings: Secondary | ICD-10-CM | POA: Diagnosis not present

## 2020-01-27 DIAGNOSIS — D649 Anemia, unspecified: Secondary | ICD-10-CM | POA: Diagnosis not present

## 2020-01-27 DIAGNOSIS — Z6833 Body mass index (BMI) 33.0-33.9, adult: Secondary | ICD-10-CM | POA: Diagnosis not present

## 2020-02-03 DIAGNOSIS — Z0000 Encounter for general adult medical examination without abnormal findings: Secondary | ICD-10-CM | POA: Diagnosis not present

## 2020-09-27 IMAGING — MG DIGITAL SCREENING BILAT W/ TOMO W/ CAD
8 series · 8 of 24 positions shown · non-contrast
Comparison: Previous exam(s).

CLINICAL DATA: Screening.

EXAM:
DIGITAL SCREENING BILATERAL MAMMOGRAM WITH TOMO AND CAD

[L CC synth-2D]
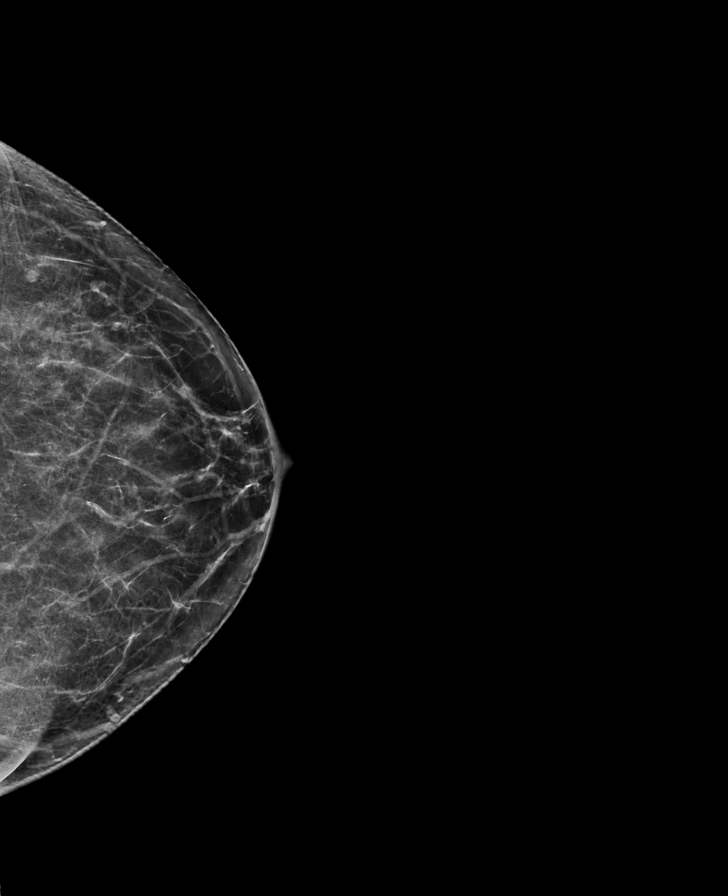

[R MLO synth-2D]
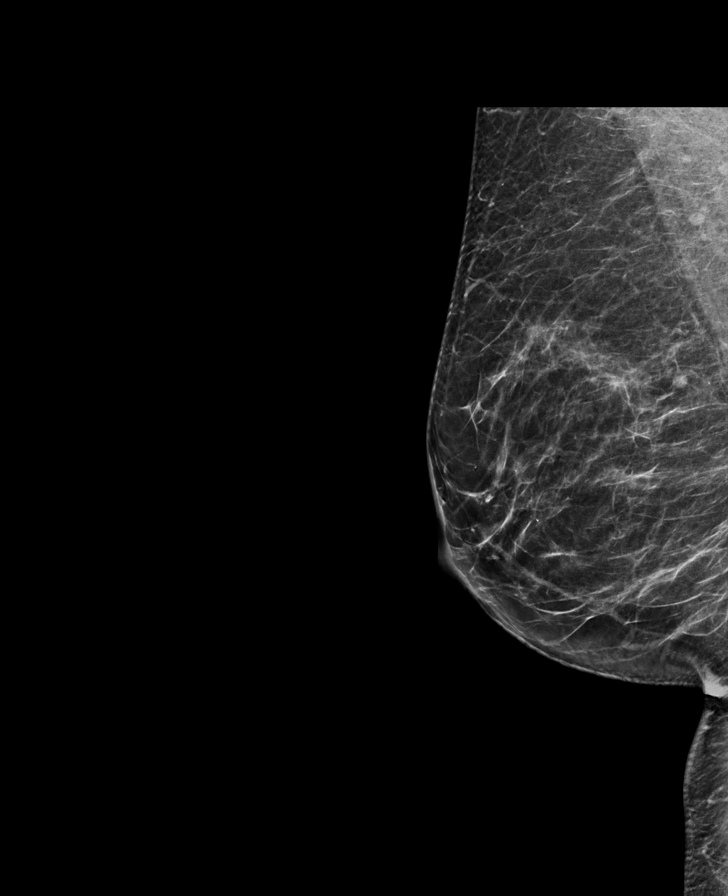

[L MLO synth-2D]
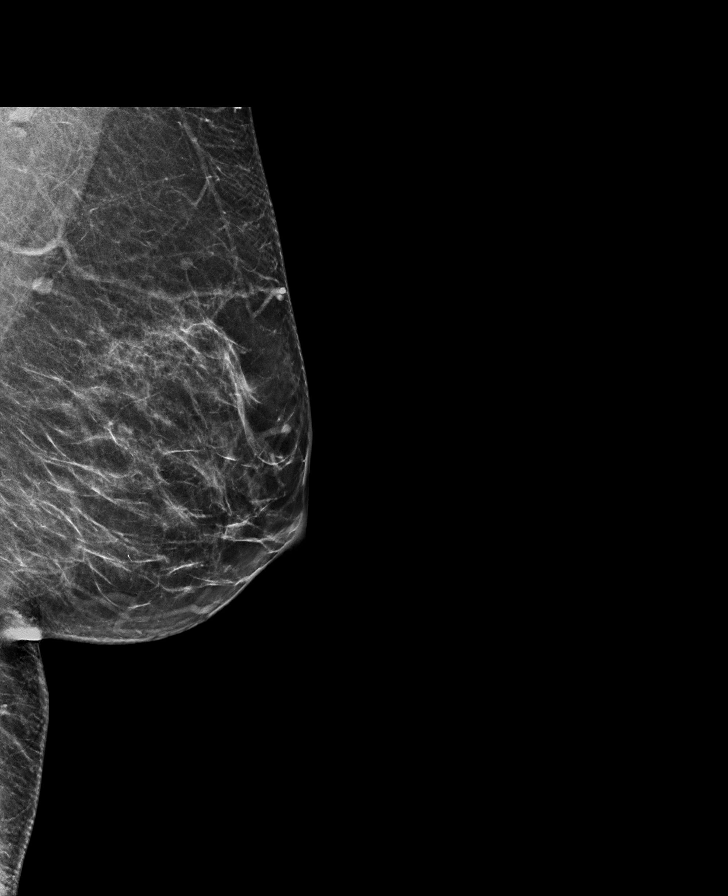

[R CC synth-2D]
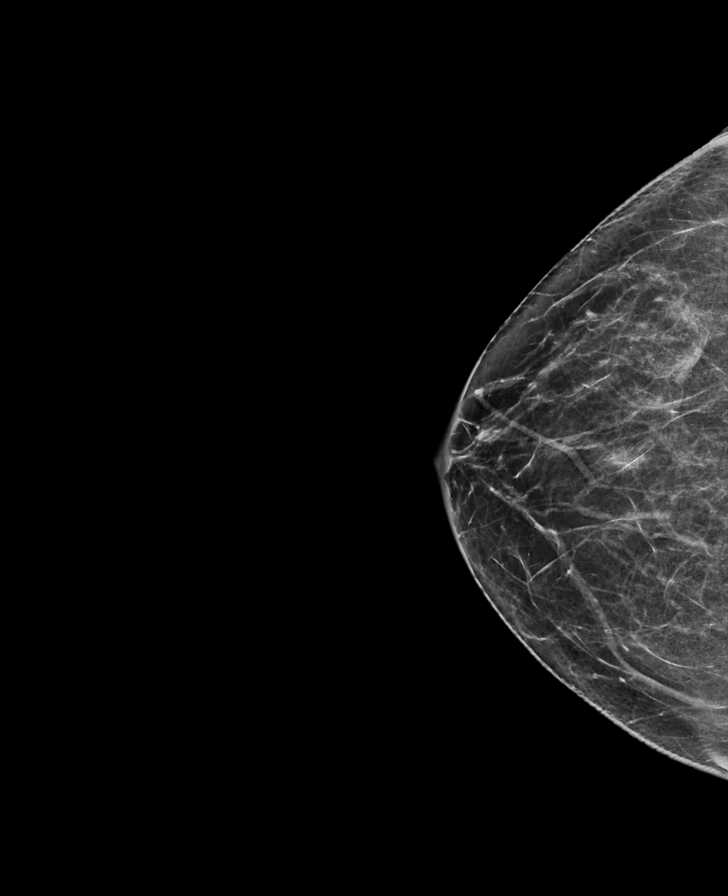

[L CC tomo · tomo slice 32/63.0]
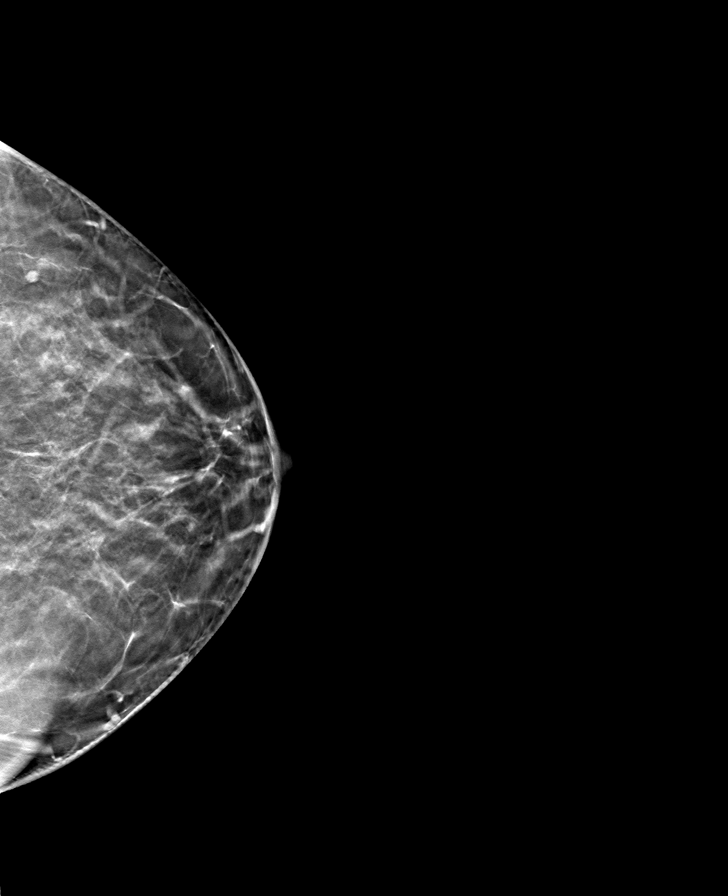

[R CC tomo · tomo slice 32/63.0]
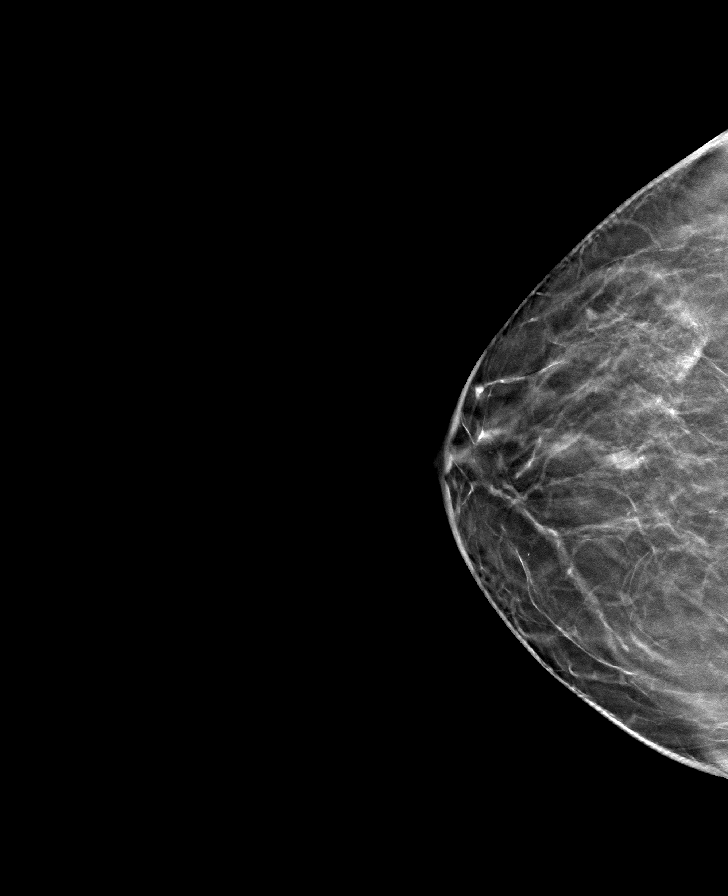

[R MLO tomo · tomo slice 33/65.0]
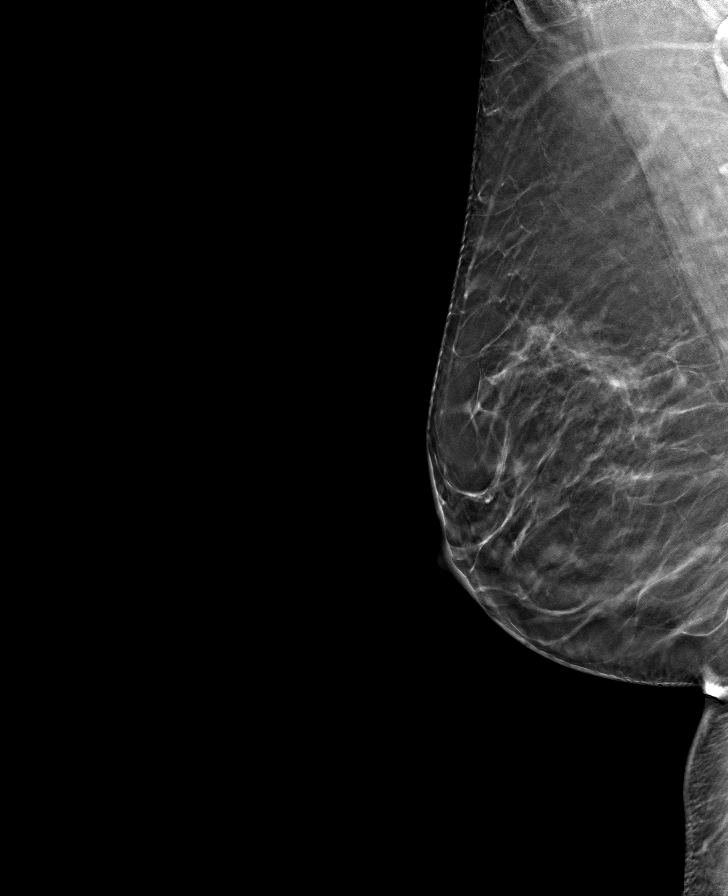

[L MLO tomo · tomo slice 33/66.0]
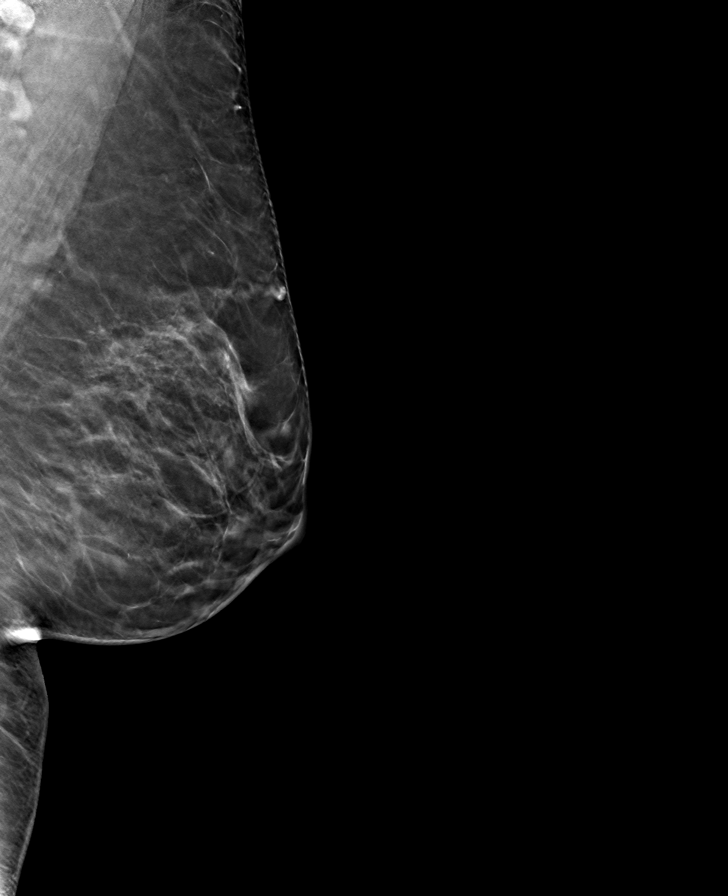

[8 of 24 positions shown; findings below may reference images not displayed]

ACR Breast Density Category b: There are scattered areas of
fibroglandular density.
FINDINGS: There are no findings suspicious for malignancy. Images were
processed with CAD.
IMPRESSION: No mammographic evidence of malignancy. A result letter of this
screening mammogram will be mailed directly to the patient.

RECOMMENDATION:
Screening mammogram in one year. (Code:[id number])

BI-RADS CATEGORY  1: Negative.

## 2022-02-13 ENCOUNTER — Other Ambulatory Visit (HOSPITAL_BASED_OUTPATIENT_CLINIC_OR_DEPARTMENT_OTHER): Payer: Self-pay | Admitting: Registered Nurse

## 2022-02-13 ENCOUNTER — Other Ambulatory Visit: Payer: Self-pay | Admitting: Registered Nurse

## 2022-02-13 ENCOUNTER — Other Ambulatory Visit (HOSPITAL_COMMUNITY): Payer: Self-pay | Admitting: Registered Nurse

## 2022-02-13 DIAGNOSIS — Z0000 Encounter for general adult medical examination without abnormal findings: Secondary | ICD-10-CM

## 2022-02-25 ENCOUNTER — Ambulatory Visit (HOSPITAL_COMMUNITY)
Admission: RE | Admit: 2022-02-25 | Discharge: 2022-02-25 | Disposition: A | Discharge status: Home / Self Care | Payer: BLUE CROSS/BLUE SHIELD | Source: Ambulatory Visit | Attending: Registered Nurse | Admitting: Registered Nurse

## 2022-02-25 DIAGNOSIS — Z0000 Encounter for general adult medical examination without abnormal findings: Secondary | ICD-10-CM | POA: Insufficient documentation

## 2022-07-25 ENCOUNTER — Other Ambulatory Visit: Payer: Self-pay | Admitting: Internal Medicine

## 2022-07-25 DIAGNOSIS — Z1231 Encounter for screening mammogram for malignant neoplasm of breast: Secondary | ICD-10-CM

## 2022-08-06 ENCOUNTER — Ambulatory Visit
Admission: RE | Admit: 2022-08-06 | Discharge: 2022-08-06 | Disposition: A | Discharge status: Home / Self Care | Payer: 59 | Source: Ambulatory Visit | Attending: Internal Medicine | Admitting: Internal Medicine

## 2022-08-06 DIAGNOSIS — Z1231 Encounter for screening mammogram for malignant neoplasm of breast: Secondary | ICD-10-CM
